# Patient Record
Sex: Male | Born: 1950 | Race: White | Hispanic: No | Marital: Married
Health system: Southern US, Community
[De-identification: ages and names within clinical notes are randomized; demographics above are authoritative.]

---

## 2011-04-18 ENCOUNTER — Inpatient Hospital Stay: Payer: Self-pay | Admitting: Internal Medicine

## 2011-06-12 ENCOUNTER — Ambulatory Visit: Payer: Self-pay

## 2011-06-27 ENCOUNTER — Ambulatory Visit: Payer: Self-pay

## 2011-10-16 ENCOUNTER — Ambulatory Visit: Payer: Self-pay | Admitting: Unknown Physician Specialty

## 2011-10-17 ENCOUNTER — Ambulatory Visit: Payer: Self-pay | Admitting: Unknown Physician Specialty

## 2011-10-17 LAB — PROTEIN, BODY FLUID: Protein, Body Fluid: 4.7 g/dL

## 2011-10-17 LAB — ALBUMIN, FLUID (OTHER): Body Fluid Albumin: 2.1 g/dL

## 2011-10-17 LAB — BODY FLUID CELL COUNT WITH DIFFERENTIAL
Basophil: 0 %
Eosinophil: 0 %
Lymphocytes: 28 %
Neutrophils: 6 %
Nucleated Cell Count: 151 /mm3

## 2011-10-17 LAB — LACTATE DEHYDROGENASE, PLEURAL OR PERITONEAL FLUID: LDH, Body Fluid: 80 U/L

## 2011-10-17 LAB — AMYLASE, BODY FLUID: Amylase, Body Fluid: 17 U/L

## 2011-10-22 LAB — BODY FLUID CULTURE

## 2011-10-29 ENCOUNTER — Ambulatory Visit: Payer: Self-pay | Admitting: Internal Medicine

## 2011-11-24 ENCOUNTER — Ambulatory Visit: Payer: Self-pay | Admitting: Internal Medicine

## 2011-12-10 LAB — DRUG SCREEN, URINE
Amphetamines, Ur Screen: NEGATIVE (ref ?–1000)
Barbiturates, Ur Screen: NEGATIVE (ref ?–200)
Benzodiazepine, Ur Scrn: NEGATIVE (ref ?–200)
Cannabinoid 50 Ng, Ur ~~LOC~~: NEGATIVE (ref ?–50)
Methadone, Ur Screen: NEGATIVE (ref ?–300)
Opiate, Ur Screen: POSITIVE (ref ?–300)
Phencyclidine (PCP) Ur S: NEGATIVE (ref ?–25)
Tricyclic, Ur Screen: NEGATIVE (ref ?–1000)

## 2011-12-12 ENCOUNTER — Ambulatory Visit: Payer: Self-pay | Admitting: Unknown Physician Specialty

## 2011-12-25 ENCOUNTER — Ambulatory Visit: Payer: Self-pay | Admitting: Internal Medicine

## 2012-01-06 ENCOUNTER — Ambulatory Visit: Payer: Self-pay | Admitting: Unknown Physician Specialty

## 2012-01-06 LAB — PROTIME-INR
INR: 1.1
Prothrombin Time: 14.8 secs — ABNORMAL HIGH (ref 11.5–14.7)

## 2012-01-06 LAB — COMPREHENSIVE METABOLIC PANEL
Anion Gap: 7 (ref 7–16)
Calcium, Total: 9 mg/dL (ref 8.5–10.1)
Chloride: 105 mmol/L (ref 98–107)
Co2: 22 mmol/L (ref 21–32)
Creatinine: 3.27 mg/dL — ABNORMAL HIGH (ref 0.60–1.30)
EGFR (African American): 23 — ABNORMAL LOW
EGFR (Non-African Amer.): 20 — ABNORMAL LOW
Potassium: 4.8 mmol/L (ref 3.5–5.1)
SGOT(AST): 23 U/L (ref 15–37)
SGPT (ALT): 10 U/L — ABNORMAL LOW (ref 12–78)

## 2012-01-06 LAB — APTT: Activated PTT: 31.5 secs (ref 23.6–35.9)

## 2012-01-25 ENCOUNTER — Ambulatory Visit: Payer: Self-pay | Admitting: Internal Medicine

## 2012-01-30 LAB — COMPREHENSIVE METABOLIC PANEL
Albumin: 2.6 g/dL — ABNORMAL LOW (ref 3.4–5.0)
Alkaline Phosphatase: 112 U/L (ref 50–136)
Anion Gap: 12 (ref 7–16)
BUN: 32 mg/dL — ABNORMAL HIGH (ref 7–18)
Bilirubin,Total: 0.7 mg/dL (ref 0.2–1.0)
Calcium, Total: 8.5 mg/dL (ref 8.5–10.1)
Chloride: 99 mmol/L (ref 98–107)
Co2: 22 mmol/L (ref 21–32)
Creatinine: 2.62 mg/dL — ABNORMAL HIGH (ref 0.60–1.30)
EGFR (African American): 30 — ABNORMAL LOW
EGFR (Non-African Amer.): 26 — ABNORMAL LOW
Glucose: 85 mg/dL (ref 65–99)
Osmolality: 273 (ref 275–301)
Potassium: 4.5 mmol/L (ref 3.5–5.1)
SGOT(AST): 16 U/L (ref 15–37)
SGPT (ALT): 9 U/L — ABNORMAL LOW (ref 12–78)
Sodium: 133 mmol/L — ABNORMAL LOW (ref 136–145)
Total Protein: 7 g/dL (ref 6.4–8.2)

## 2012-01-30 LAB — CBC WITH DIFFERENTIAL/PLATELET
Basophil #: 0.1 10*3/uL (ref 0.0–0.1)
Basophil %: 1.3 %
Eosinophil %: 2.7 %
HGB: 9.6 g/dL — ABNORMAL LOW (ref 13.0–18.0)
Lymphocyte #: 1.4 10*3/uL (ref 1.0–3.6)
Lymphocyte %: 25.6 %
Monocyte %: 10.3 %
Neutrophil %: 60.1 %
RBC: 3.43 10*6/uL — ABNORMAL LOW (ref 4.40–5.90)
RDW: 17.2 % — ABNORMAL HIGH (ref 11.5–14.5)
WBC: 5.3 10*3/uL (ref 3.8–10.6)

## 2012-02-02 ENCOUNTER — Ambulatory Visit: Payer: Self-pay | Admitting: Internal Medicine

## 2012-02-24 ENCOUNTER — Ambulatory Visit: Payer: Self-pay | Admitting: Internal Medicine

## 2012-03-26 ENCOUNTER — Ambulatory Visit: Payer: Self-pay | Admitting: Internal Medicine

## 2012-04-25 DEATH — deceased

## 2013-08-05 IMAGING — US ABDOMEN ULTRASOUND LIMITED
1 series · 9 of 9 positions shown · non-contrast
Comparison: none

REASON FOR EXAM: alcoholic cirrhosis eval for Ascites
COMMENTS:

[Series 1: abdomen ultrasound limited · 0.31mm/px · 9 of 9 slices shown]
[im 1/9]
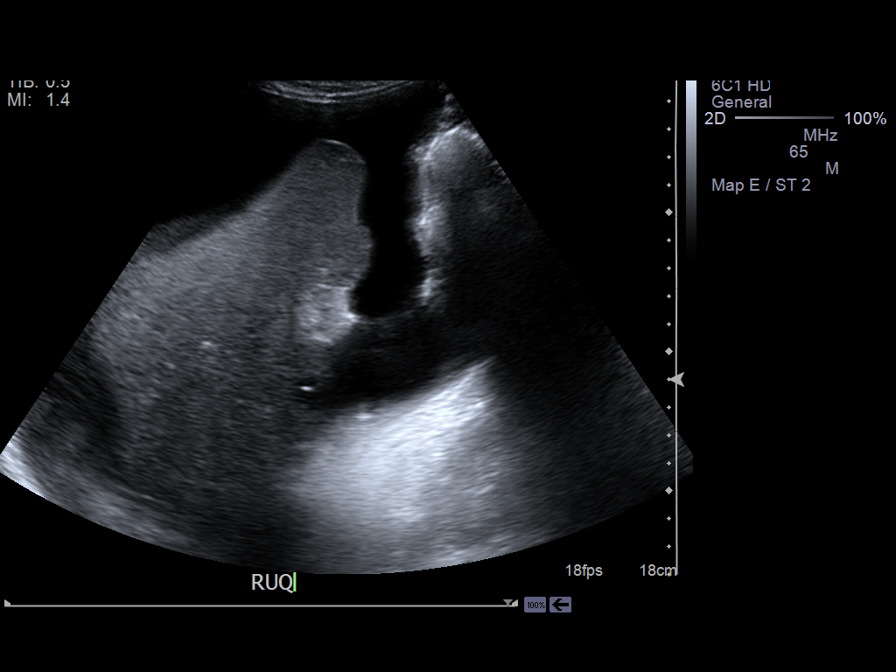
[im 2/9]
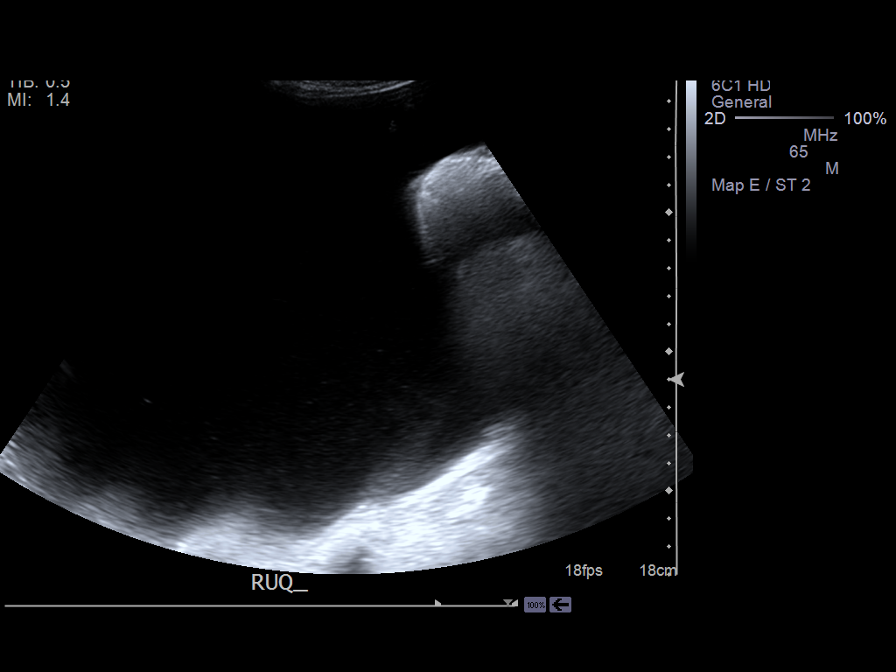
[im 3/9]
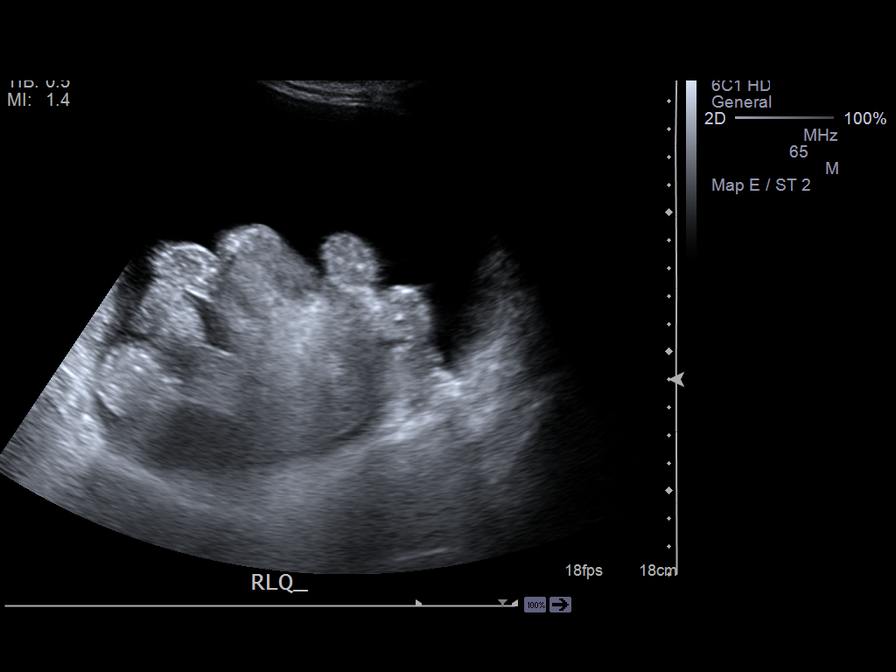
[im 4/9]
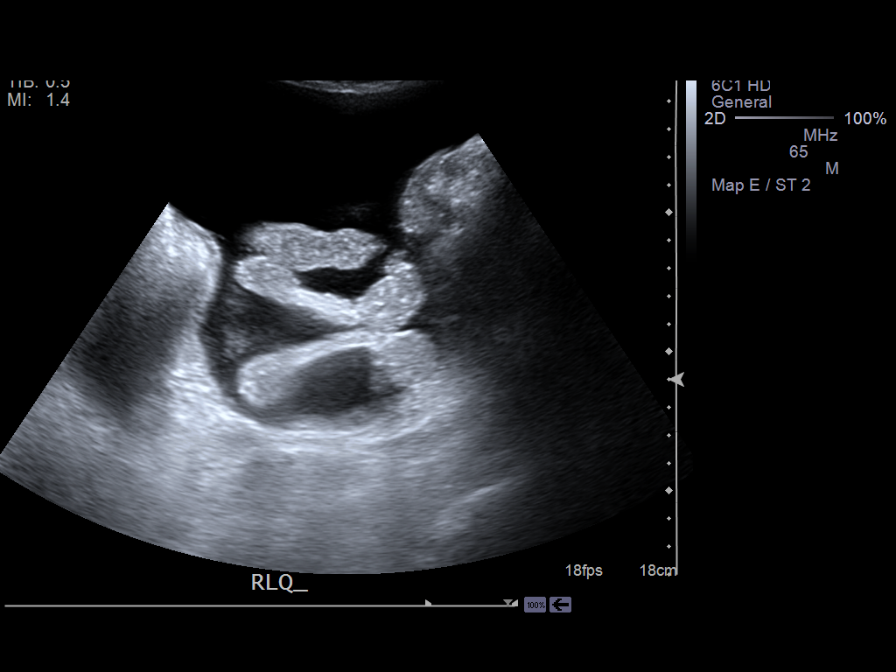
[im 5/9]
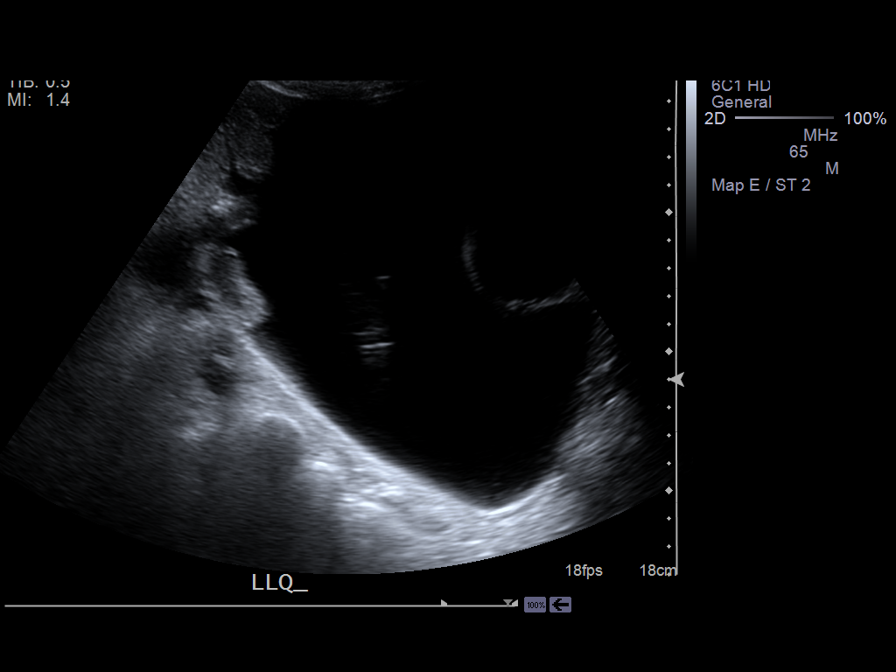
[im 6/9]
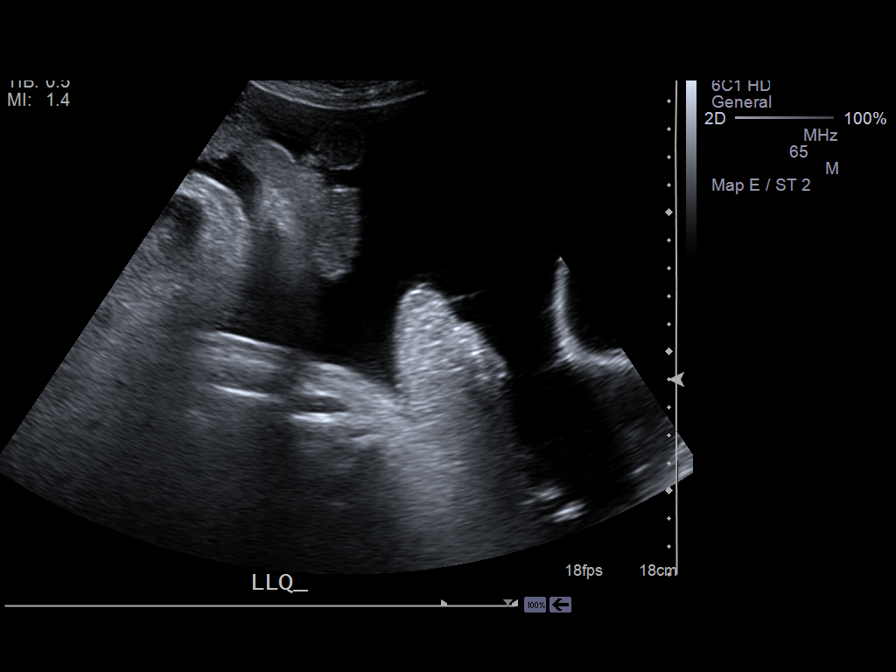
[im 7/9]
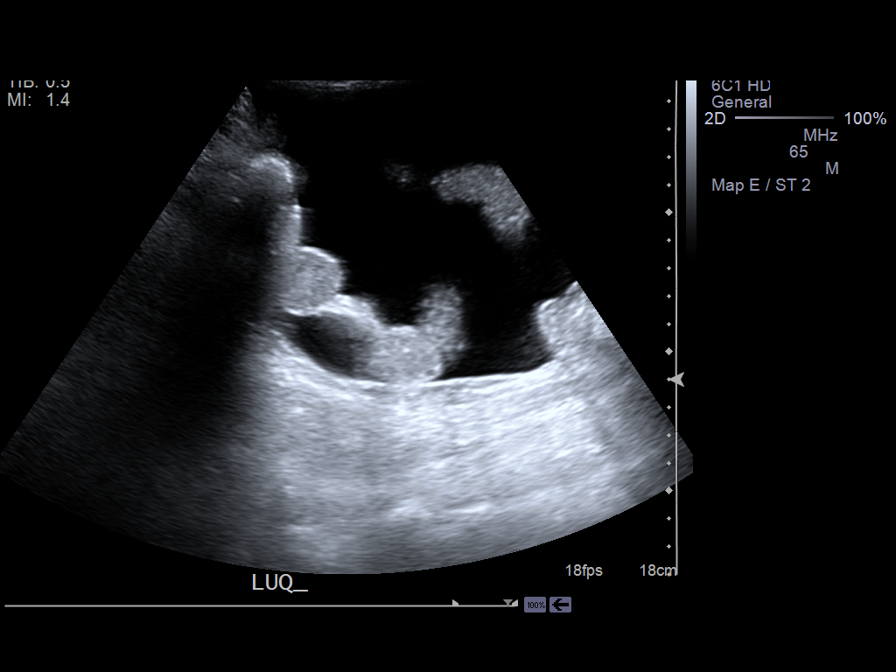
[im 8/9]
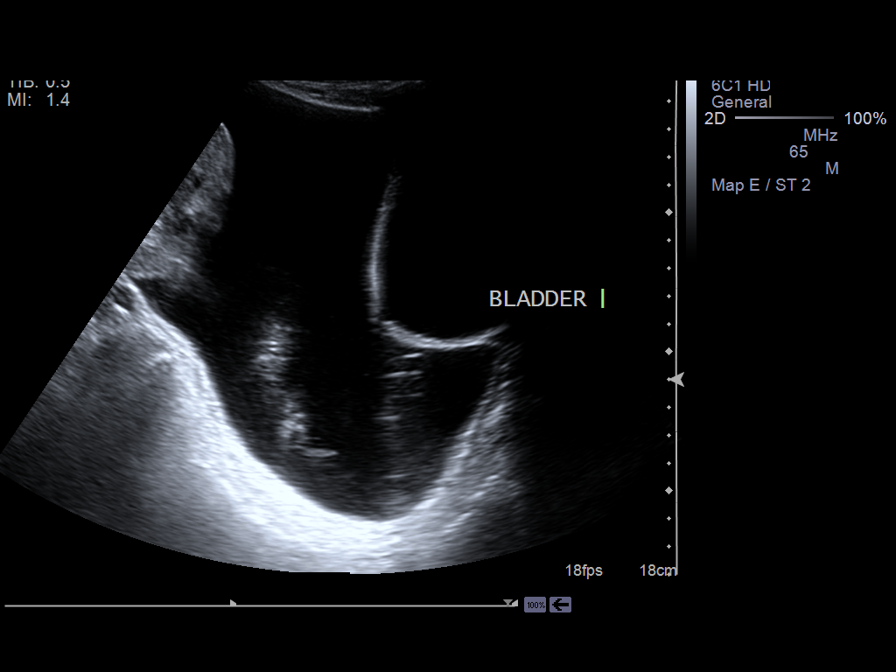
[im 9/9]
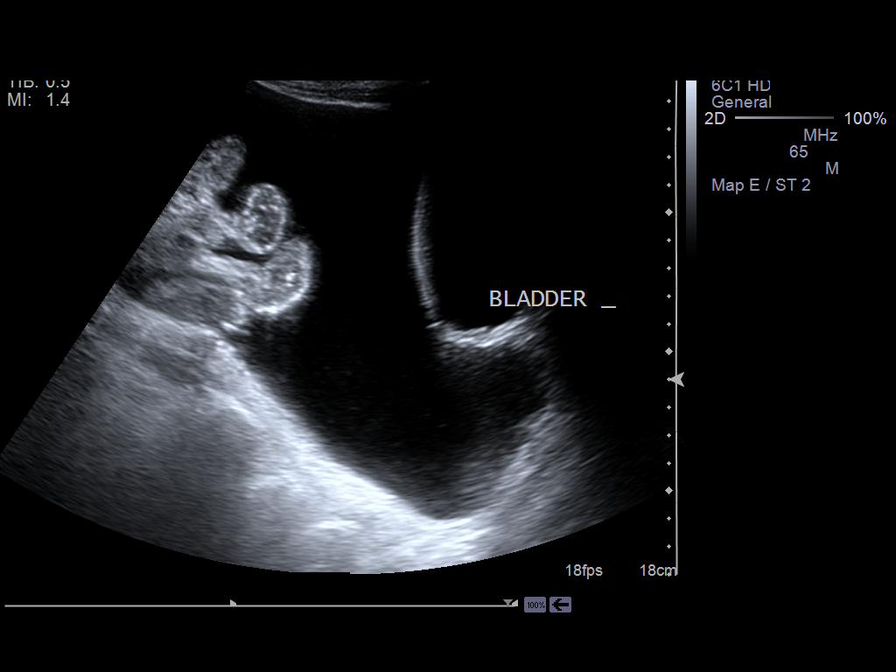

[9 of 9 positions shown; findings below may reference images not displayed]

PROCEDURE:     US  - US ABDOMEN LIMITED SURVEY  - October 16, 2011  [DATE]

RESULT:     Limited abdominal sonogram to assess for ascites is performed.
There appears to be a moderate to large amount of ascites present in the
right upper quadrant, both lower quadrants and in the lower midline region.
A moderate amount of fluid is seen in the left upper quadrant.
IMPRESSION: Moderate to large amount of ascites present in the abdomen
and pelvis.

[REDACTED]

## 2013-10-26 IMAGING — US US GUIDE NEEDLE - US PARA
1 series · 9 of 9 positions shown · non-contrast
Comparison: none

REASON FOR EXAM: Ascites     albumin inj when done
COMMENTS:

[Series 1: us guide needle - us para · 0.31mm/px · 9 of 9 slices shown]
[im 1/9]
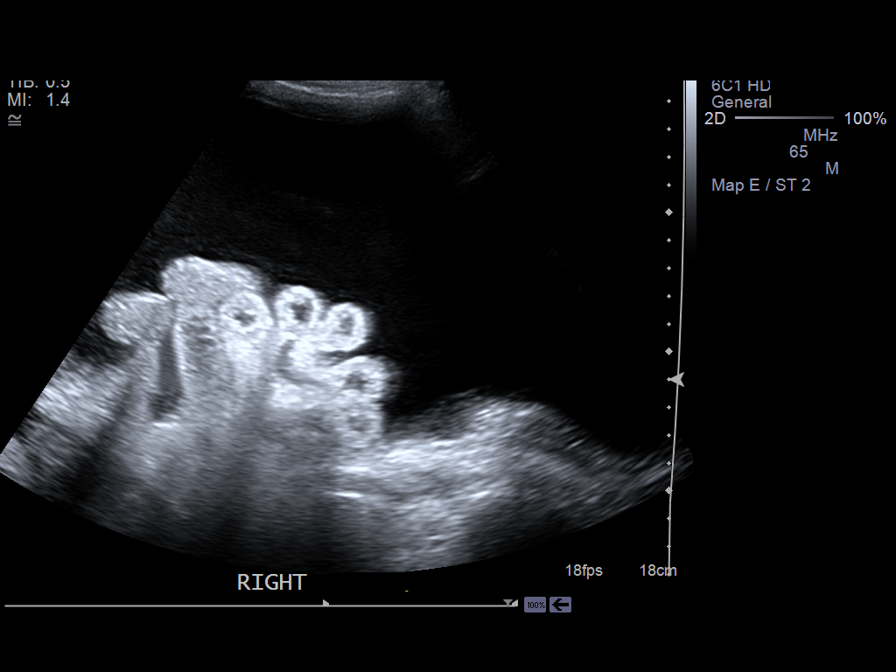
[im 2/9]
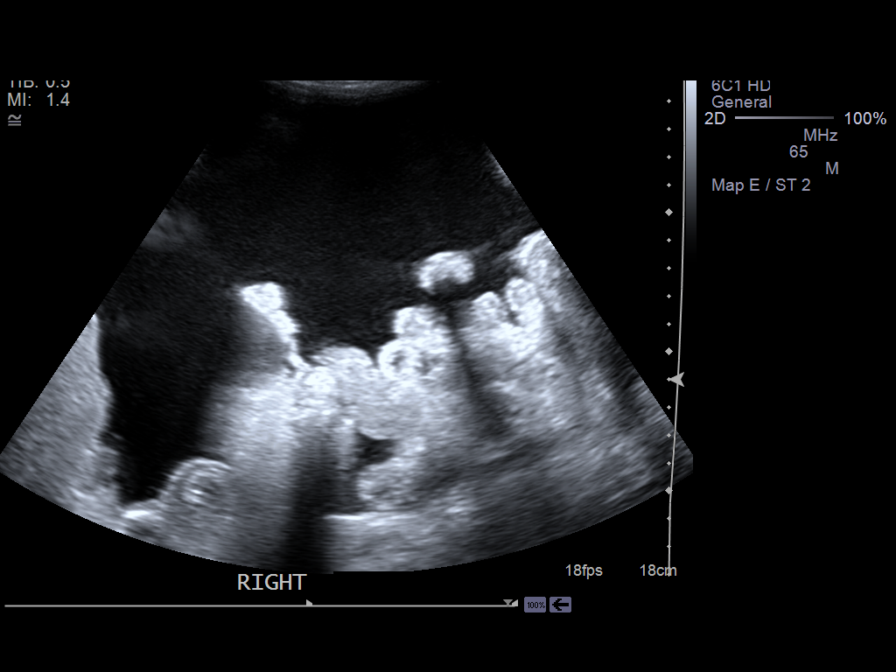
[im 3/9]
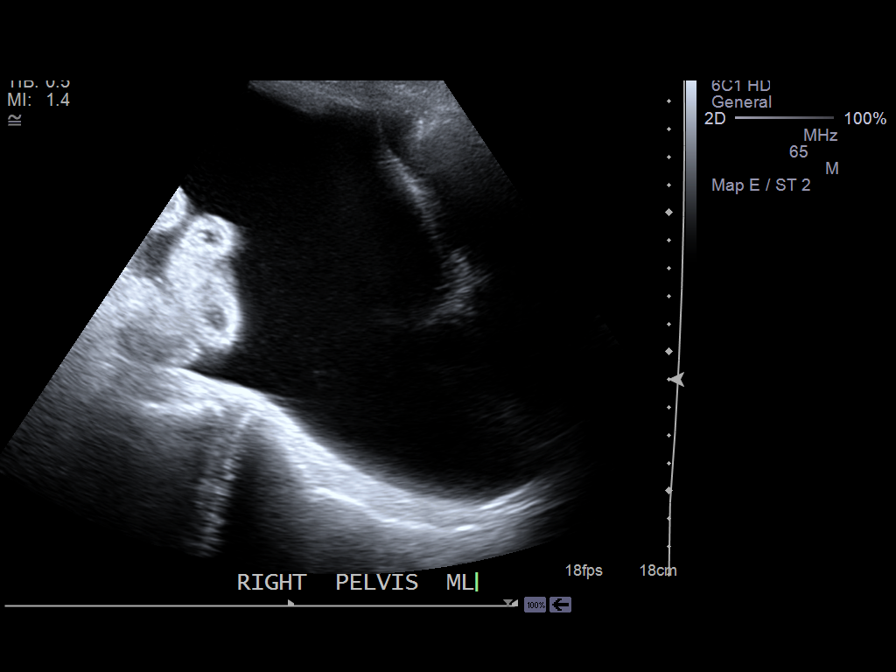
[im 4/9]
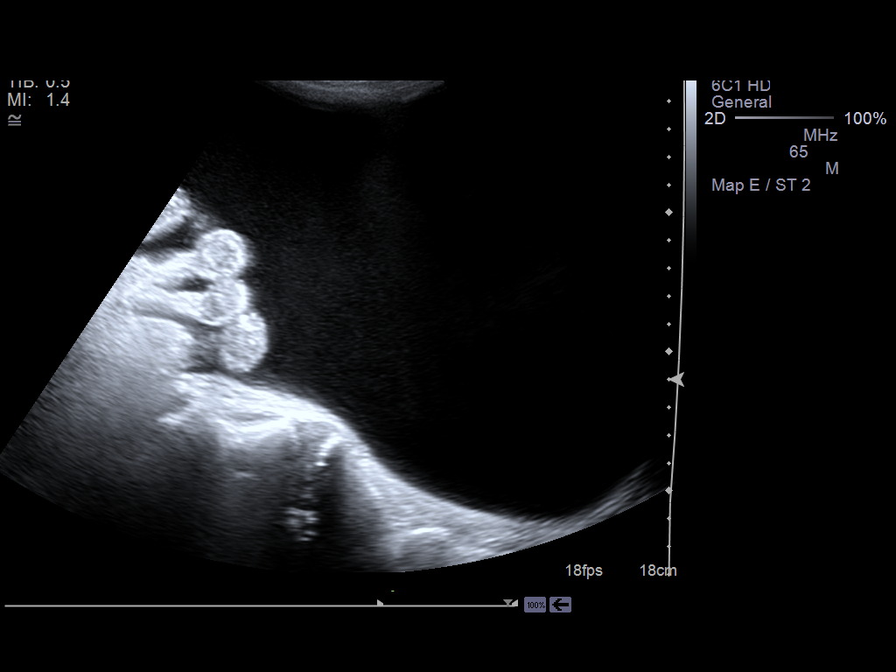
[im 5/9]
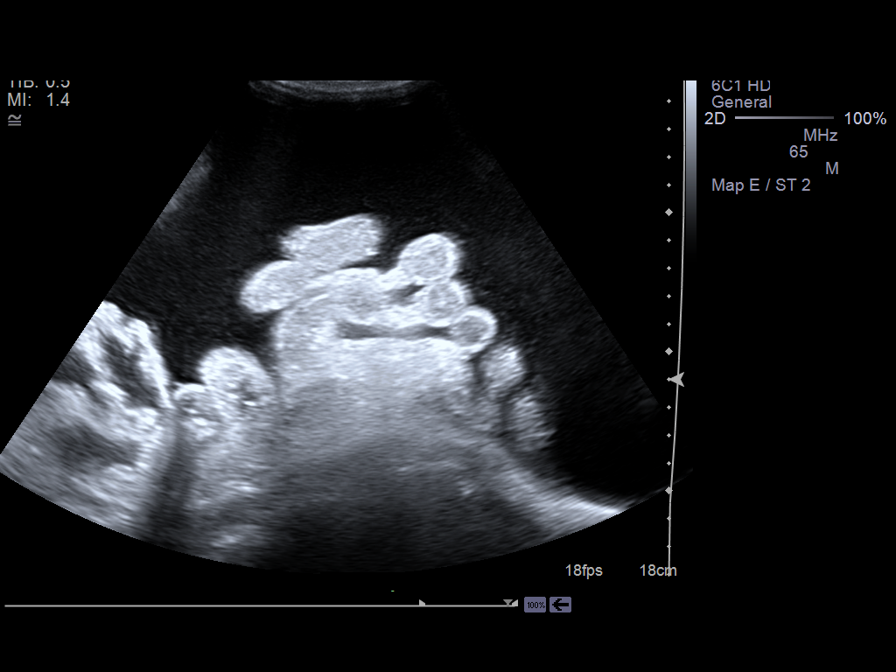
[im 6/9]
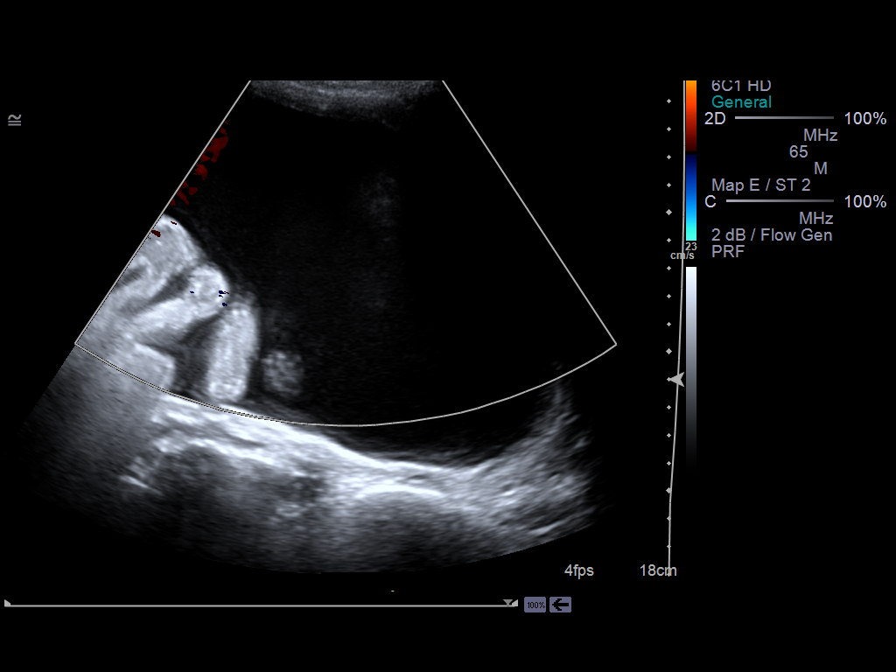
[im 7/9]
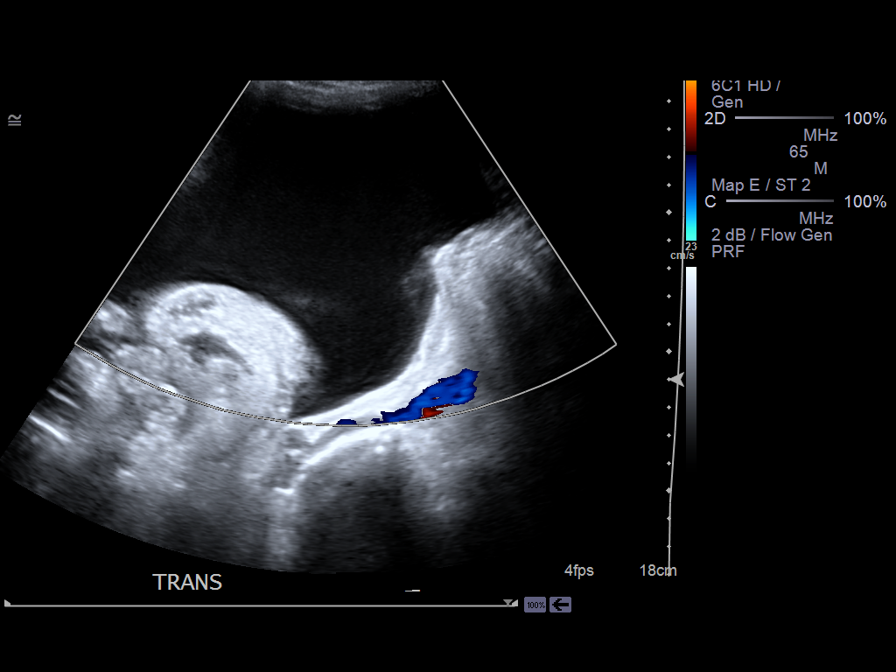
[im 8/9]
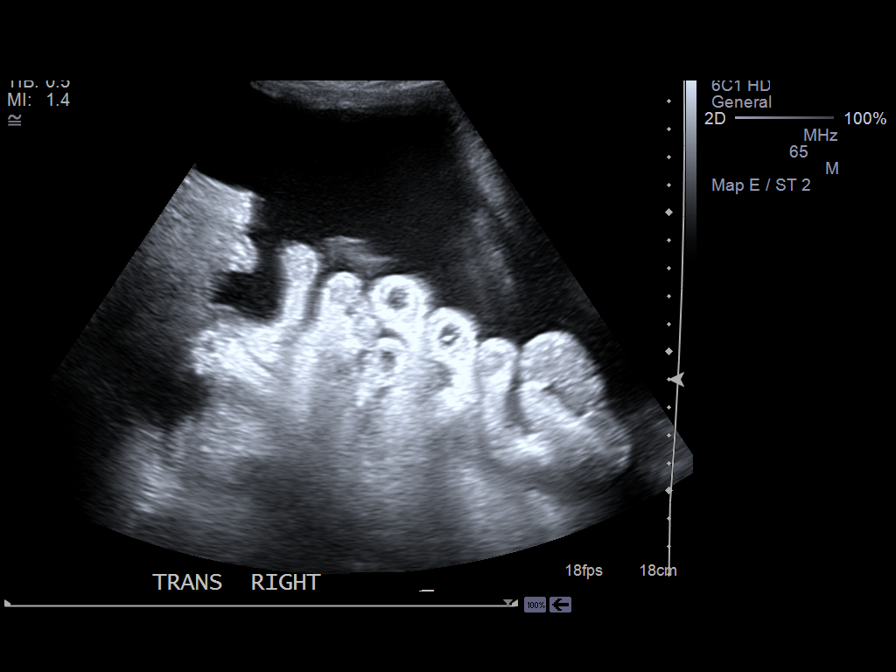
[im 9/9]
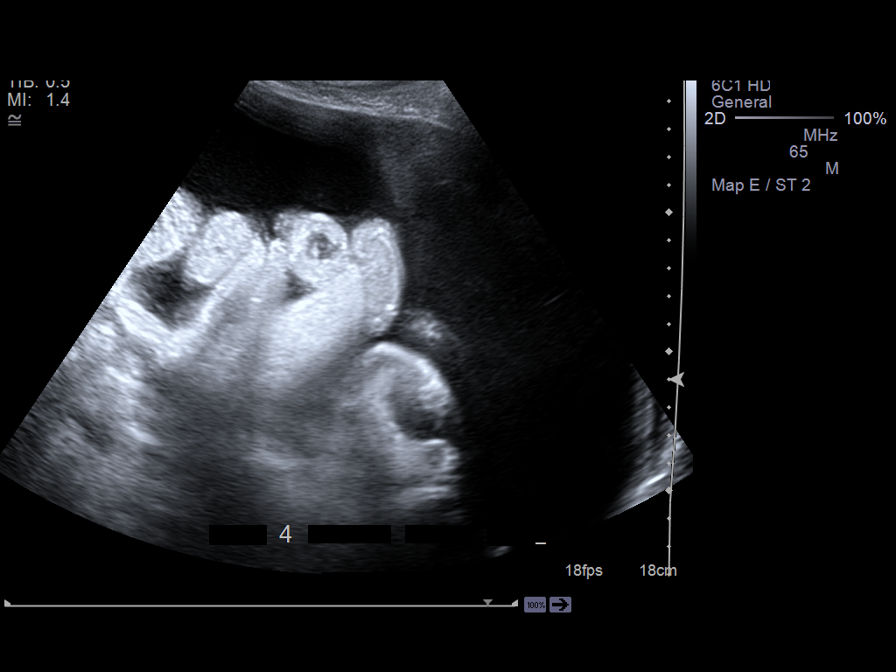

[9 of 9 positions shown; findings below may reference images not displayed]

PROCEDURE:     US  - US GUIDED PARACENTESIS  - January 06, 2012  [DATE]

RESULT:

Procedure:  The patient was informed of the risks and benefits of the
procedure and proper informed consent was obtained. The patient was brought
to the Ultrasound Suite and placed in a supine position. The abdomen was
evaluated and proper entry site for ultrasound-guided paracentesis was
established within the left lower quadrant. The overlying soft tissues were
then prepped and draped in usual sterile fashion. Utilizing 8 mL of 1%
lidocaine without epinephrine, the overlying soft tissues were anesthetized.
A small dermatomy was formed at the entry site. The peritoneal cavity was
cannulated with Don Lolito Onacram paracentesis catheter. 1888 mL of yellow to
straw-colored fluid was removed from the peritoneal space. At the conclusion
of 1888 mL, the procedure was stopped. The catheter was removed and the
patient tolerated the procedure without complications.
IMPRESSION: Ultrasound-guided paracentesis as described above.

## 2013-11-22 IMAGING — US US GUIDE NEEDLE - US PARA
1 series · 5 of 5 positions shown · non-contrast
Comparison: none

REASON FOR EXAM: ascites
COMMENTS:

PROCEDURE:     US  - US GUIDED PARACENTESIS  - February 02, 2012 [DATE]
RESULT:     Ultrasound Guided Paracentesis
INDICATION: Patient with end-stage liver disease presents with ascites
refractory to medical management.  Paracentesis is requested.

[Series 1: us guide needle - us para · 0.31mm/px · 5 of 5 slices shown]
[im 1/5]
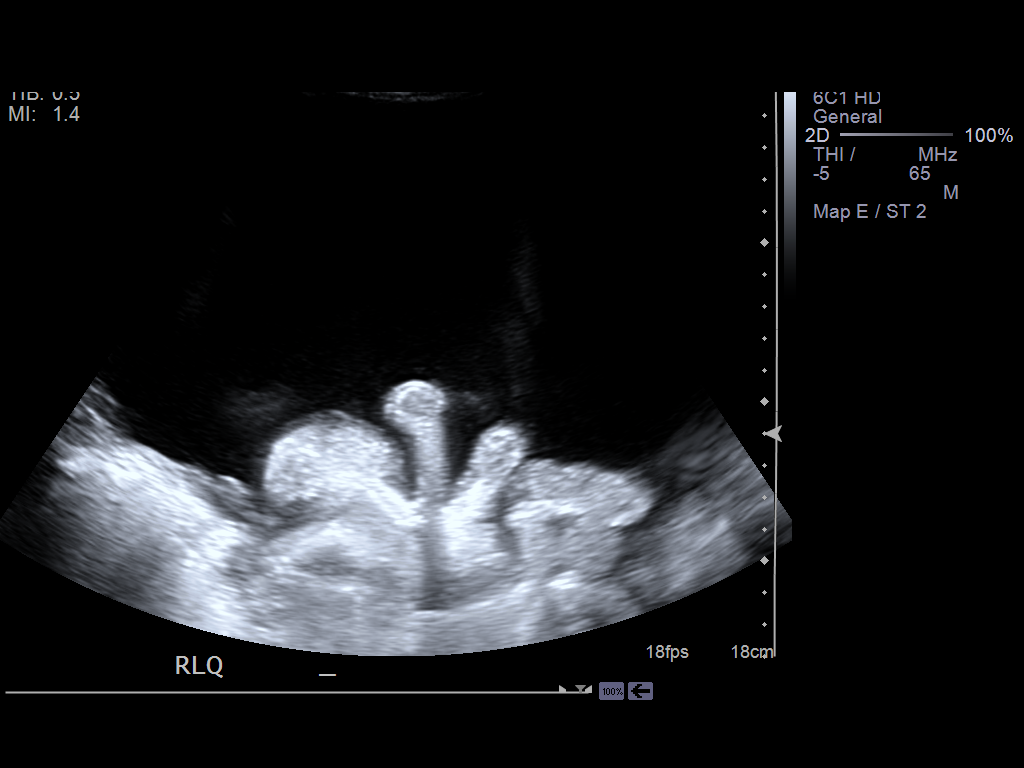
[im 2/5]
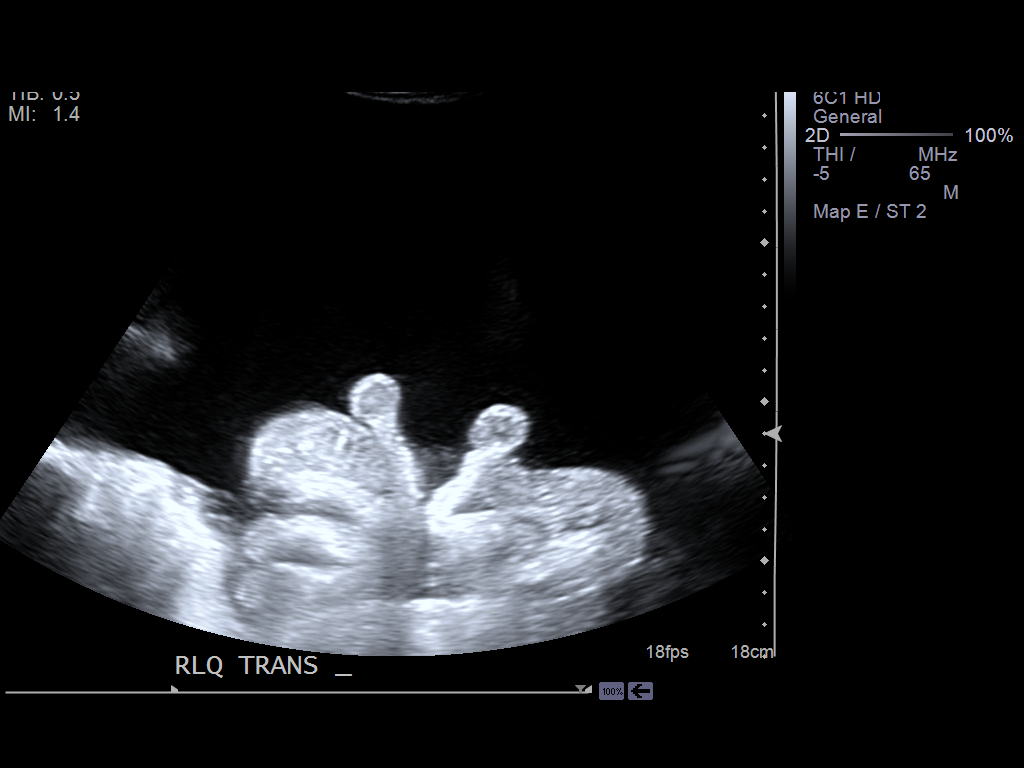
[im 3/5]
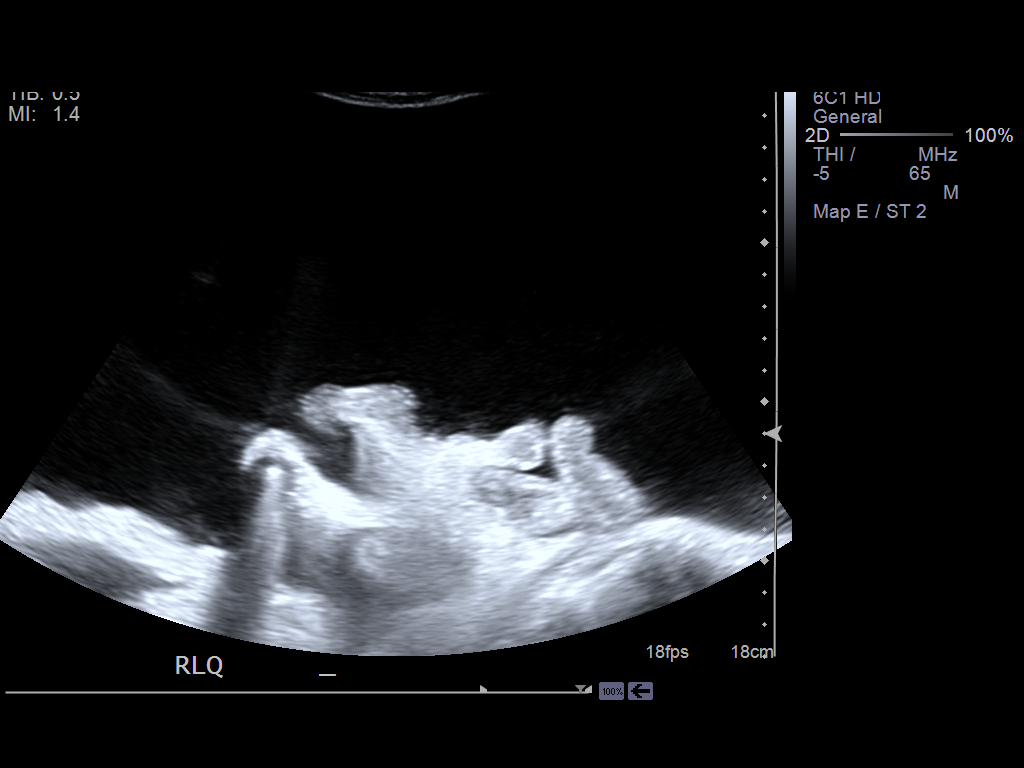
[im 4/5]
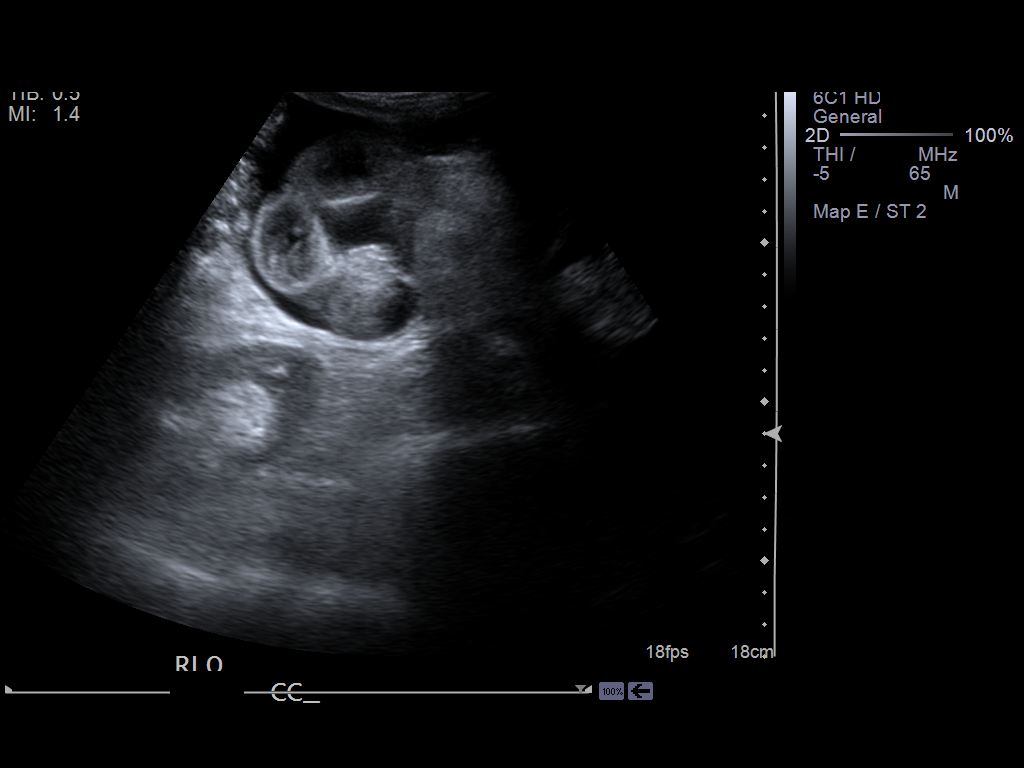
[im 5/5]
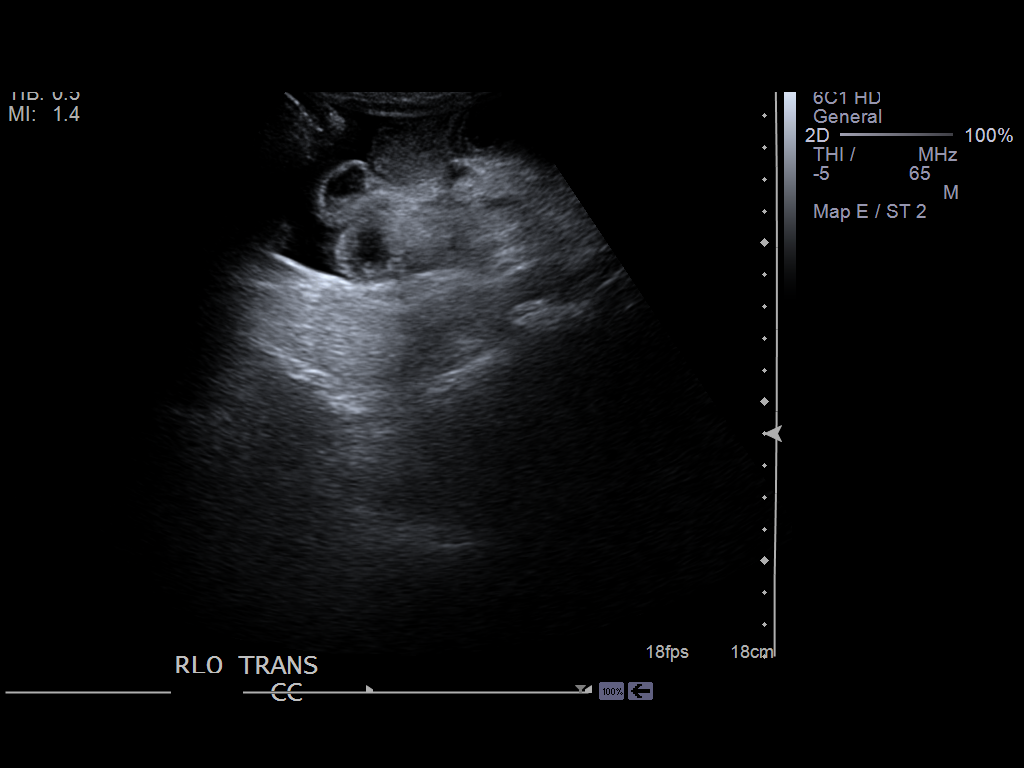

[5 of 5 positions shown; findings below may reference images not displayed]

Comparisons: None

Procedure:

Clinical assessment was performed and informed consent obtained. The patient
was brought to the ultrasound suite and placed in the supine position.
Focused abdominal ultrasound demonstrated a large amount of ascites. The
most accessible pocket was identified in the right lower quadrant and marked
for paracentesis.

A formal timeout procedure was performed according to departmental protocol.
The abdomen was prepped and draped in the usual sterile manner. The
overlying skin was anesthetized with 6 ml of 1% lidocaine. The peritoneal
cavity was accessed using a 6 French Safe-T centesis needle catheter system
and connected to a Vacutainer.

5663 ml of straw-colored ascitic fluid was removed.  The drainage catheter
was removed and a sterile dressing placed at the puncture site. The
procedure was well tolerated and without complication. Hemostasis was
achieved.
IMPRESSION: Uncomplicated ultrasound guided paracentesis.

[REDACTED]

## 2014-09-12 NOTE — Consult Note (Signed)
    Comments   Labs ordered and sent to hospice RN in anticipation of paracentesis next week.   Electronic Signatures: Nicki Gracy, Daryl EasternJoshua R (NP)  (Signed 05-Sep-13 15:56)  Authored: Palliative Care   Last Updated: 05-Sep-13 15:56 by Malachy MoanBorders, Abrina Petz R (NP)

## 2014-09-17 NOTE — Consult Note (Signed)
    Comments   Oxycontin refilled for 2 weeks with followup appt scheduled. Called and spoke with Hospice RN with no issues in the home. Opioid regimen reported to be working well without adverse effects. Pt may need therapeutic paracentesis soon but will defer to GI.   Electronic Signatures: Kymoni Lesperance, Daryl EasternJoshua R (NP)  (Signed 03-Jul-13 08:32)  Authored: Palliative Care   Last Updated: 03-Jul-13 08:32 by Malachy MoanBorders, Shaquayla Klimas R (NP)

## 2014-09-17 NOTE — Consult Note (Signed)
PATIENT NAME:  Jared Franklin, Alem J MR#:  [id number] DATE OF BIRTH:  August 09, 1950  DATE OF CONSULTATION:  04/18/2011  REFERRING PHYSICIAN:   CONSULTING PHYSICIAN:  Scot Junobert T. Tani Virgo, MD  HISTORY: The patient is a 64 year old white male who has a history of chronic alcoholism and developed extensive abdominal swelling over the last couple of months and what was most bothersome to him was his scrotum and penis swelled up considerably. He came to the Emergency Room for evaluation and was found to have a hemoglobin 6.4 and was admitted to the hospital. I was asked to see him in consultation.   The patient denies any fever. No vomiting. No rectal bleeding. He has had increasing swelling and muscle wasting over the last 2 or 3 months.   ALLERGIES: No known drug allergies.   MEDICATION: Multivitamins only.   FAMILY HISTORY: Mother with lung cancer and colon polyps or colon cancer. Father with alcoholism and cirrhosis of the liver.   SOCIAL HISTORY: He smokes a pack and half a day for 40 years or more. Drinks at least a fifth of rum, wine and sometimes beer.   EMPLOYMENT: The patient was working in [employer] for [employer], is currently unemployed.   see next page of dictation  DICTATION STOPPED  ____________________________ Scot Junobert T. Anjeli Casad, MD rte:ap D: 04/18/2011 16:04:44 ET T: 04/18/2011 16:31:13 ET JOB#: [id number] cc: Scot Junobert T. Christinia Lambeth, MD, <Dictator> Scot JunOBERT T Abdulmalik Darco MD ELECTRONICALLY SIGNED 05/30/2011 19:50

## 2014-09-17 NOTE — Consult Note (Signed)
PATIENT NAME:  Jared Franklin, Jared Franklin MR#:  [id number] DATE OF BIRTH:  05-Apr-1951  DATE OF CONSULTATION:  04/18/2011  REFERRING PHYSICIAN:   CONSULTING PHYSICIAN:  Scot Junobert T. Daesha Insco, MD  ADDENDUM:  REVIEW OF SYSTEMS: Positive for abdominal pain and swelling, lower extremity swelling, scrotal swelling. No fever or chills. No coughing or wheezing. No chest pains or skipping irregular heartbeats. No pain or burning with urination.   PHYSICAL EXAMINATION:   GENERAL: White male with diffuse muscle wasting and a very protuberant abdomen.   VITAL SIGNS: Temperature 97.9, pulse anywhere from 98 to 130, respirations 20, blood pressure 120/80.   HEENT: Sclerae nonicteric. Conjunctivae negative. Slightly pale. Tongue slightly pale. Overall skin slightly pale. Head is atraumatic.   NECK: Trachea is in the midline.   CHEST: Clear. No rales or rhonchi.   HEART: Normal S1, S2.   ABDOMEN: Significant distention with flatness to percussion. No palpable organomegaly. No caput medusae on his abdomen.   GU: His scrotum is massively enlarged and penis is enlarged also both from fluid and edema.   EXTREMITIES: He has some compression hose on. Extremities show 1+ edema.   NEUROLOGIC: The patient is oriented, pleasant, and appropriate at this time.   LABORATORY, DIAGNOSTIC, AND RADIOLOGICAL DATA: Ferritin 16. Hemoglobin 6.4, platelet count 177, MCV 77, alkaline phosphatase 140, AST 74, ALT 28.  Ultrasound shows large amount of ascites. Liver is dense consistent with clinical history of cirrhosis. 2 cm cyst in the right lobe. Spleen is normal size. Contracted gallbladder. The wall is thickened but this is a nonspecific finding with ascites.   ASSESSMENT:  1. Alcoholic cirrhosis of the liver.  2. Ascites with swelling of the scrotum, penis, and lower extremities secondary to ascites.  3. Anemia, iron deficiency. This is a little concerning given his family history of colon cancer. He eventually should get a  colonoscopy once his ascites is under control.   RECOMMENDATIONS:  1. Spironolactone 100 mg along with 40 mg of Lasix p.o. per day.  2. He likely will go into alcohol withdrawal given the large volumes of alcohol that he consumes and will need to be on CIWA protocol.  3. A tap would be appropriate. This can be arranged for Monday.  4. I would transfuse with 1 or 2 units of blood but no more than that. Would not bring his hemoglobin above 8.5 range.   The patient has requested to be a DO NOT RESUSCITATE when I asked him about his CODE STATUS and papers were signed for this decision.  I will follow with you. I would keep a low threshold for starting empiric antibiotics should he show any signs of elevated white count, fever, or abdominal pain. It is not unusual to have a thickened gallbladder wall with the presence of ascites and this is a very nonspecific finding. Cysts of the liver are common and 2 cm cyst merely needs to be noted and followed up long-term. I agree with starting nadolol. He is somewhat deficient in magnesium. Agree with giving him magnesium replacement. Agree with the pantoprazole starting empirically.   His pro-time is elevated. He is getting some Vitamin K to replace that as well as thiamine. I will follow with you.   ____________________________ Scot Junobert T. Justin Buechner, MD rte:drc D: 04/18/2011 16:12:56 ET T: 04/18/2011 16:56:32 ET JOB#: [id number] cc: Scot Junobert T. Florentino Laabs, MD, <Dictator> Scot JunOBERT T Luisalberto Beegle MD ELECTRONICALLY SIGNED 05/30/2011 19:49
